# Patient Record
Sex: Female | Born: 1985 | Race: White | Hispanic: No | Marital: Single | Smoking: Current every day smoker
Health system: Southern US, Community
[De-identification: ages and names within clinical notes are randomized; demographics above are authoritative.]

## PROBLEM LIST (undated history)

## (undated) DIAGNOSIS — K219 Gastro-esophageal reflux disease without esophagitis: Secondary | ICD-10-CM

## (undated) DIAGNOSIS — J45909 Unspecified asthma, uncomplicated: Secondary | ICD-10-CM

## (undated) DIAGNOSIS — C801 Malignant (primary) neoplasm, unspecified: Secondary | ICD-10-CM

## (undated) DIAGNOSIS — E237 Disorder of pituitary gland, unspecified: Secondary | ICD-10-CM

## (undated) DIAGNOSIS — G43909 Migraine, unspecified, not intractable, without status migrainosus: Secondary | ICD-10-CM

## (undated) DIAGNOSIS — R569 Unspecified convulsions: Secondary | ICD-10-CM

## (undated) HISTORY — PX: TUBAL LIGATION: SHX77

## (undated) HISTORY — PX: TERATOMA EXCISION: SHX2491

## (undated) HISTORY — PX: MOUTH SURGERY: SHX715

## (undated) HISTORY — DX: Unspecified asthma, uncomplicated: J45.909

## (undated) HISTORY — DX: Migraine, unspecified, not intractable, without status migrainosus: G43.909

---
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
## 2006-02-15 ENCOUNTER — Emergency Department: Payer: Self-pay | Admitting: Emergency Medicine

## 2007-05-12 ENCOUNTER — Emergency Department: Payer: Self-pay | Admitting: Emergency Medicine

## 2007-05-28 ENCOUNTER — Emergency Department: Payer: Self-pay | Admitting: Emergency Medicine

## 2009-02-05 ENCOUNTER — Emergency Department: Payer: Self-pay | Admitting: Emergency Medicine

## 2009-08-03 ENCOUNTER — Emergency Department: Payer: Self-pay | Admitting: Emergency Medicine

## 2010-03-02 ENCOUNTER — Emergency Department: Payer: Self-pay | Admitting: Unknown Physician Specialty

## 2010-06-19 ENCOUNTER — Emergency Department: Payer: Self-pay | Admitting: Emergency Medicine

## 2010-10-20 ENCOUNTER — Emergency Department: Payer: Self-pay | Admitting: *Deleted

## 2011-03-11 ENCOUNTER — Emergency Department: Payer: Self-pay | Admitting: Emergency Medicine

## 2011-03-15 ENCOUNTER — Emergency Department: Payer: Self-pay | Admitting: Emergency Medicine

## 2011-08-08 ENCOUNTER — Emergency Department: Payer: Self-pay | Admitting: Emergency Medicine

## 2011-12-01 ENCOUNTER — Emergency Department: Payer: Self-pay | Admitting: Emergency Medicine

## 2012-05-19 ENCOUNTER — Emergency Department: Payer: Self-pay | Admitting: Emergency Medicine

## 2012-08-18 ENCOUNTER — Emergency Department: Payer: Self-pay | Admitting: Emergency Medicine

## 2012-09-05 ENCOUNTER — Emergency Department: Payer: Self-pay | Admitting: Unknown Physician Specialty

## 2013-01-09 ENCOUNTER — Emergency Department: Payer: Self-pay | Admitting: Emergency Medicine

## 2013-01-12 ENCOUNTER — Emergency Department: Payer: Self-pay | Admitting: Emergency Medicine

## 2013-03-18 ENCOUNTER — Emergency Department: Payer: Self-pay | Admitting: Emergency Medicine

## 2013-06-16 ENCOUNTER — Emergency Department: Payer: Self-pay | Admitting: Emergency Medicine

## 2013-06-16 LAB — RAPID INFLUENZA A&B ANTIGENS

## 2013-06-19 LAB — BETA STREP CULTURE(ARMC)

## 2014-02-08 ENCOUNTER — Emergency Department: Payer: Self-pay | Admitting: Emergency Medicine

## 2014-02-08 LAB — BASIC METABOLIC PANEL
ANION GAP: 8 (ref 7–16)
BUN: 10 mg/dL (ref 7–18)
CREATININE: 0.67 mg/dL (ref 0.60–1.30)
Calcium, Total: 8.1 mg/dL — ABNORMAL LOW (ref 8.5–10.1)
Chloride: 105 mmol/L (ref 98–107)
Co2: 25 mmol/L (ref 21–32)
GLUCOSE: 106 mg/dL — AB (ref 65–99)
Potassium: 4.5 mmol/L (ref 3.5–5.1)
SODIUM: 138 mmol/L (ref 136–145)

## 2014-02-08 LAB — CBC
HCT: 43.8 % (ref 35.0–47.0)
HGB: 14.3 g/dL (ref 12.0–16.0)
MCH: 29.4 pg (ref 26.0–34.0)
MCHC: 32.6 g/dL (ref 32.0–36.0)
MCV: 90 fL (ref 80–100)
Platelet: 316 10*3/uL (ref 150–440)
RBC: 4.85 10*6/uL (ref 3.80–5.20)
RDW: 13.5 % (ref 11.5–14.5)
WBC: 12.4 10*3/uL — AB (ref 3.6–11.0)

## 2014-02-08 LAB — TROPONIN I: Troponin-I: <0.02 ng/mL

## 2014-05-10 ENCOUNTER — Emergency Department: Payer: Self-pay | Admitting: Emergency Medicine

## 2014-05-12 ENCOUNTER — Emergency Department: Payer: Self-pay | Admitting: Emergency Medicine

## 2014-06-15 IMAGING — CR DG CHEST 2V
1 series · 2 of 2 positions shown · non-contrast
Comparison: PA and lateral chest 03/18/2013.

CLINICAL DATA: Cough, congestion and wheezing.

EXAM:
CHEST  2 VIEW

[Series 1: w chest pa · 0.14mm/px · 2 of 2 slices shown]
[im 1/2]
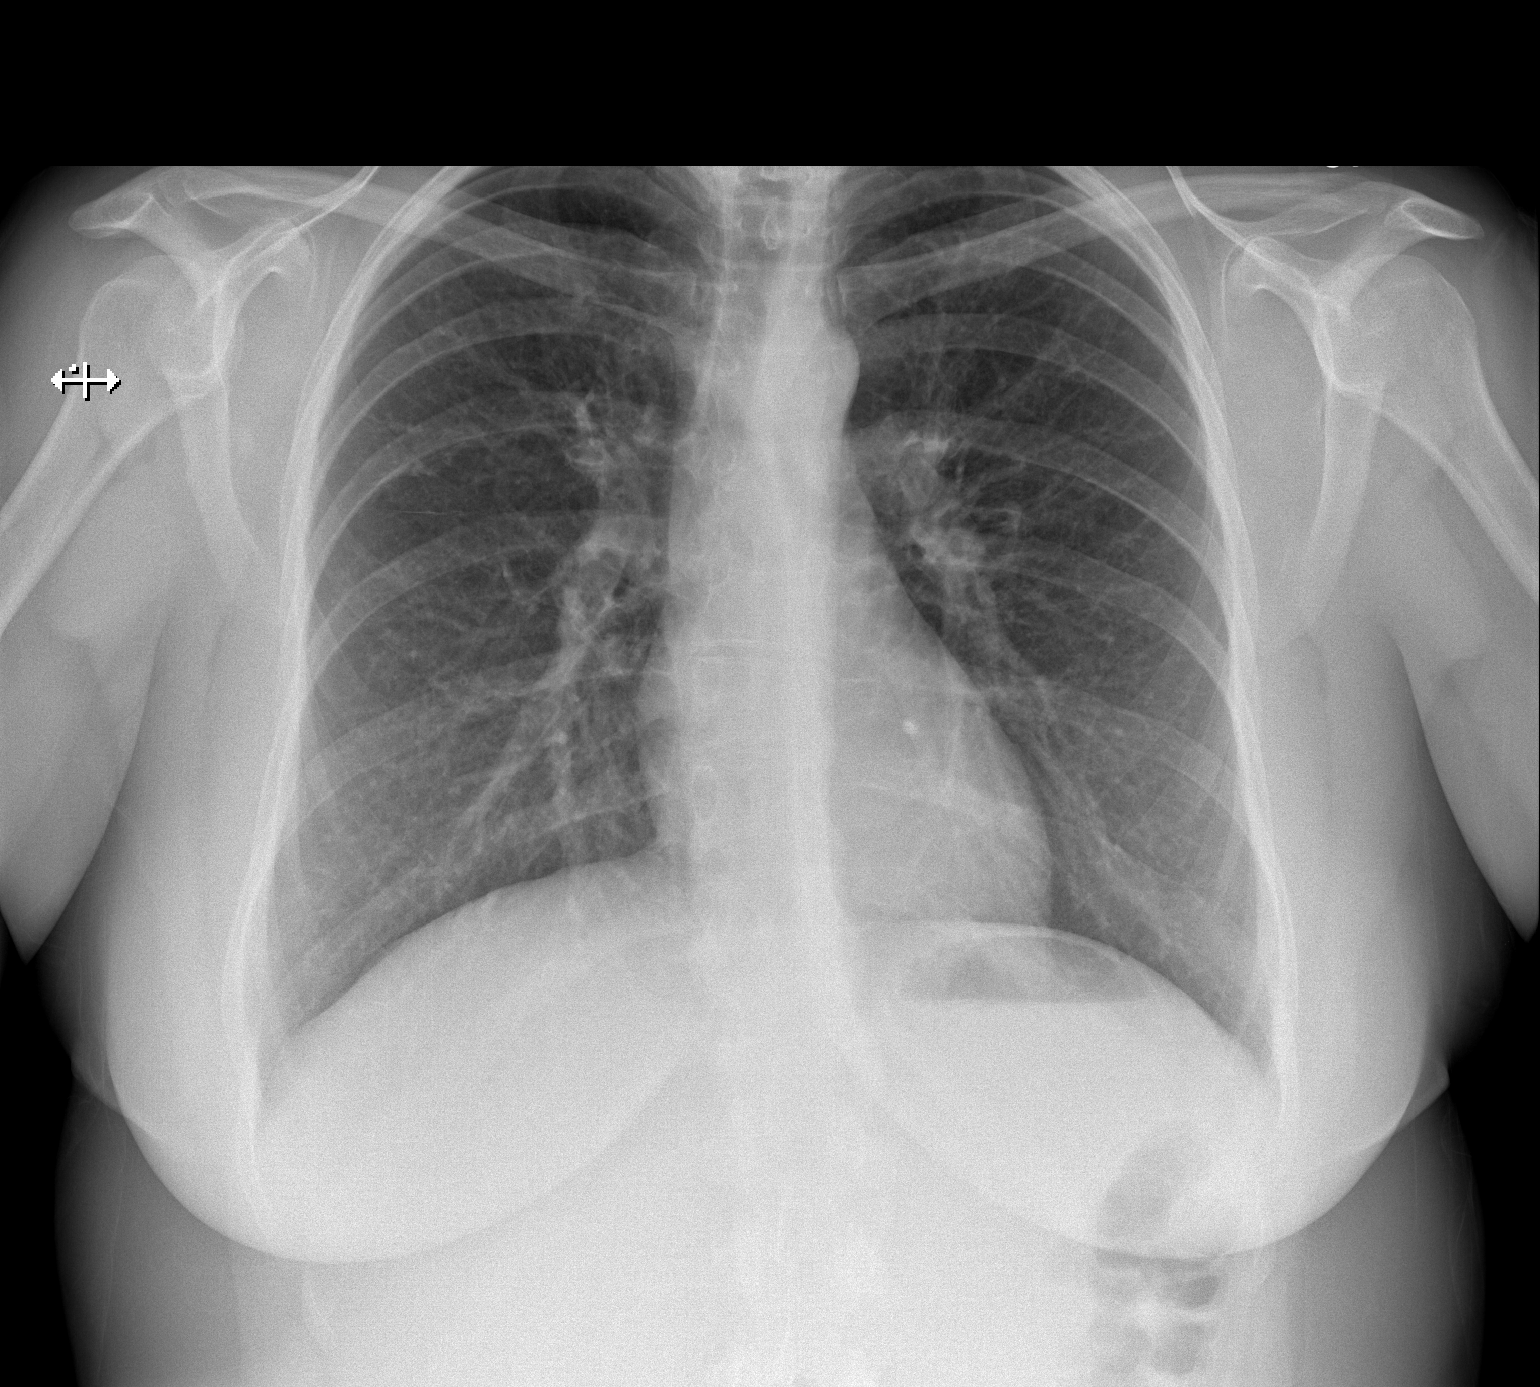
[im 2/2]
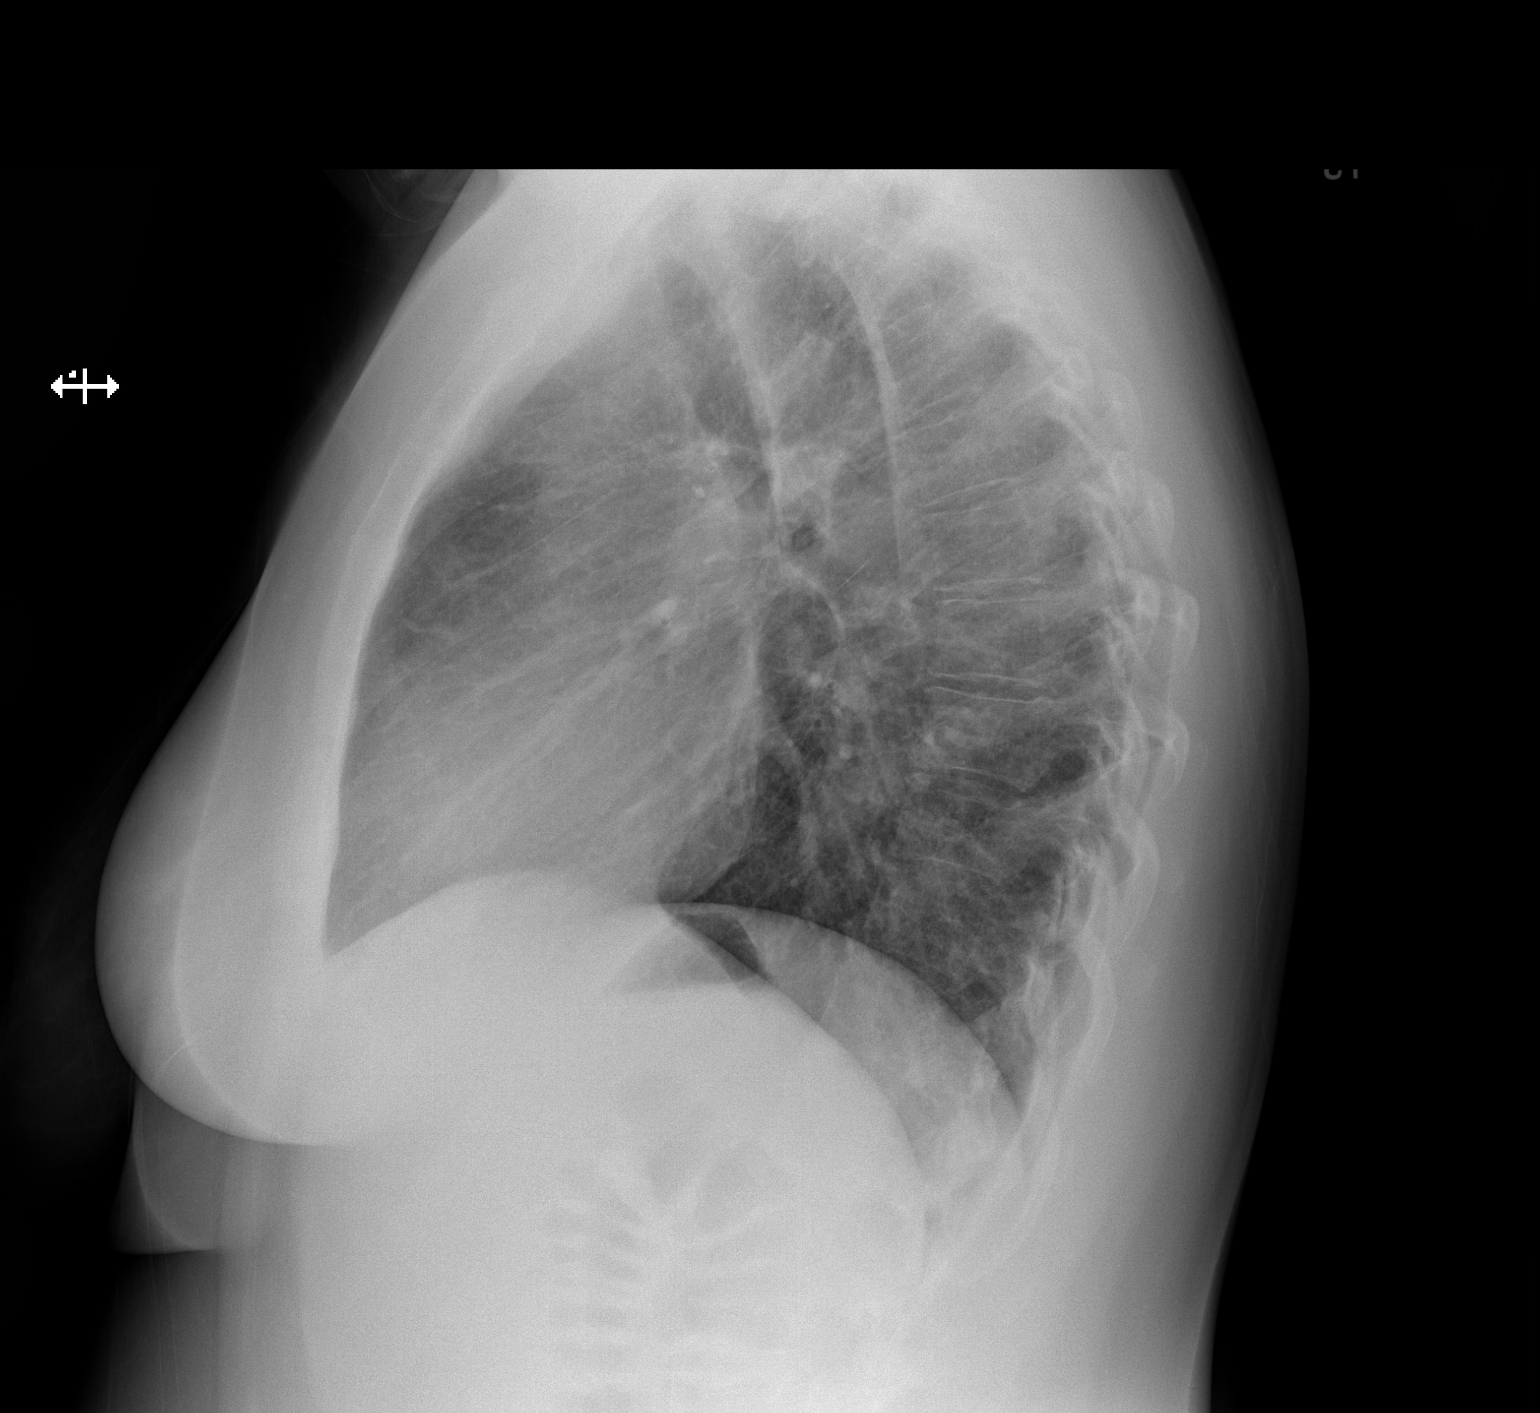

[2 of 2 positions shown; findings below may reference images not displayed]

FINDINGS: Lungs clear. Heart size normal. No pneumothorax or pleural effusion.
IMPRESSION: Negative chest.

## 2014-06-23 ENCOUNTER — Ambulatory Visit: Payer: Self-pay | Admitting: Nurse Practitioner

## 2014-06-28 LAB — CBC AND DIFFERENTIAL
HEMATOCRIT: 40 % (ref 36–46)
Hemoglobin: 13.8 g/dL (ref 12.0–16.0)
NEUTROS ABS: 7 /uL
Platelets: 342 10*3/uL (ref 150–399)
WBC: 12.2 10^3/mL

## 2014-06-28 LAB — BASIC METABOLIC PANEL
BUN: 14 mg/dL (ref 4–21)
Creatinine: 0.6 mg/dL (ref 0.5–1.1)
Glucose: 77 mg/dL
POTASSIUM: 3.8 mmol/L (ref 3.4–5.3)
Sodium: 141 mmol/L (ref 137–147)

## 2014-06-28 LAB — HEPATIC FUNCTION PANEL
ALK PHOS: 71 U/L (ref 25–125)
ALT: 19 U/L (ref 7–35)
AST: 15 U/L (ref 13–35)
BILIRUBIN DIRECT: 0.09 mg/dL (ref 0.01–0.4)
Bilirubin, Total: 0.3 mg/dL

## 2014-06-28 LAB — HEMOGLOBIN A1C: Hemoglobin A1C: 5.6

## 2014-06-28 LAB — TSH: TSH: 1.58 u[IU]/mL (ref 0.41–5.90)

## 2014-08-30 ENCOUNTER — Emergency Department
Admission: EM | Admit: 2014-08-30 | Discharge: 2014-08-30 | Disposition: A | Discharge status: Home / Self Care | Payer: Self-pay | Attending: Emergency Medicine | Admitting: Emergency Medicine

## 2014-08-30 ENCOUNTER — Emergency Department: Payer: Self-pay

## 2014-08-30 ENCOUNTER — Encounter: Payer: Self-pay | Admitting: Emergency Medicine

## 2014-08-30 DIAGNOSIS — Z91040 Latex allergy status: Secondary | ICD-10-CM | POA: Insufficient documentation

## 2014-08-30 DIAGNOSIS — Y9289 Other specified places as the place of occurrence of the external cause: Secondary | ICD-10-CM | POA: Insufficient documentation

## 2014-08-30 DIAGNOSIS — W500XXA Accidental hit or strike by another person, initial encounter: Secondary | ICD-10-CM | POA: Insufficient documentation

## 2014-08-30 DIAGNOSIS — Y998 Other external cause status: Secondary | ICD-10-CM | POA: Insufficient documentation

## 2014-08-30 DIAGNOSIS — Z720 Tobacco use: Secondary | ICD-10-CM | POA: Insufficient documentation

## 2014-08-30 DIAGNOSIS — Z880 Allergy status to penicillin: Secondary | ICD-10-CM | POA: Insufficient documentation

## 2014-08-30 DIAGNOSIS — Y9389 Activity, other specified: Secondary | ICD-10-CM | POA: Insufficient documentation

## 2014-08-30 DIAGNOSIS — S9031XA Contusion of right foot, initial encounter: Secondary | ICD-10-CM | POA: Insufficient documentation

## 2014-08-30 MED ORDER — KETOROLAC TROMETHAMINE 30 MG/ML IJ SOLN
60.0000 mg | Freq: Once | INTRAMUSCULAR | Status: AC
  Administered 2014-08-30: 60 mg via INTRAMUSCULAR

## 2014-08-30 MED ORDER — IBUPROFEN 800 MG PO TABS: 800.0000 mg | ORAL_TABLET | Freq: Three times a day (TID) | ORAL | Status: DC | PRN

## 2014-08-30 MED ORDER — KETOROLAC TROMETHAMINE 30 MG/ML IJ SOLN
INTRAMUSCULAR | Status: AC
  Administered 2014-08-30: 60 mg via INTRAMUSCULAR
  Filled 2014-08-30: qty 1

## 2014-08-30 MED ORDER — KETOROLAC TROMETHAMINE 60 MG/2ML IM SOLN
INTRAMUSCULAR | Status: AC
  Filled 2014-08-30: qty 2

## 2014-08-30 NOTE — Discharge Instructions (Signed)
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
## 2014-09-01 ENCOUNTER — Telehealth: Payer: Self-pay | Admitting: Emergency Medicine

## 2014-11-16 ENCOUNTER — Emergency Department
Admission: EM | Admit: 2014-11-16 | Discharge: 2014-11-16 | Disposition: A | Discharge status: Home / Self Care | Payer: Self-pay | Attending: Student | Admitting: Student

## 2014-11-16 ENCOUNTER — Emergency Department: Payer: Self-pay

## 2014-11-16 ENCOUNTER — Encounter: Payer: Self-pay | Admitting: Emergency Medicine

## 2014-11-16 DIAGNOSIS — Y9389 Activity, other specified: Secondary | ICD-10-CM | POA: Insufficient documentation

## 2014-11-16 DIAGNOSIS — S63501A Unspecified sprain of right wrist, initial encounter: Secondary | ICD-10-CM | POA: Insufficient documentation

## 2014-11-16 DIAGNOSIS — W231XXA Caught, crushed, jammed, or pinched between stationary objects, initial encounter: Secondary | ICD-10-CM | POA: Insufficient documentation

## 2014-11-16 DIAGNOSIS — Z91040 Latex allergy status: Secondary | ICD-10-CM | POA: Insufficient documentation

## 2014-11-16 DIAGNOSIS — Y998 Other external cause status: Secondary | ICD-10-CM | POA: Insufficient documentation

## 2014-11-16 DIAGNOSIS — Y92007 Garden or yard of unspecified non-institutional (private) residence as the place of occurrence of the external cause: Secondary | ICD-10-CM | POA: Insufficient documentation

## 2014-11-16 DIAGNOSIS — Z720 Tobacco use: Secondary | ICD-10-CM | POA: Insufficient documentation

## 2014-11-16 DIAGNOSIS — Z3202 Encounter for pregnancy test, result negative: Secondary | ICD-10-CM | POA: Insufficient documentation

## 2014-11-16 DIAGNOSIS — Z880 Allergy status to penicillin: Secondary | ICD-10-CM | POA: Insufficient documentation

## 2014-11-16 HISTORY — DX: Unspecified convulsions: R56.9

## 2014-11-16 HISTORY — DX: Gastro-esophageal reflux disease without esophagitis: K21.9

## 2014-11-16 HISTORY — DX: Disorder of pituitary gland, unspecified: E23.7

## 2014-11-16 LAB — POCT PREGNANCY, URINE: PREG TEST UR: NEGATIVE

## 2014-11-16 MED ORDER — IBUPROFEN 800 MG PO TABS
800.0000 mg | ORAL_TABLET | Freq: Once | ORAL | Status: AC
  Administered 2014-11-16: 800 mg via ORAL
  Filled 2014-11-16: qty 1

## 2014-11-16 MED ORDER — NAPROXEN 500 MG PO TABS
500.0000 mg | ORAL_TABLET | Freq: Once | ORAL | Status: DC
Start: 2014-11-16 — End: 2014-11-16
  Filled 2014-11-16: qty 1

## 2014-11-16 MED ORDER — NAPROXEN 500 MG PO TABS: 500.0000 mg | ORAL_TABLET | Freq: Two times a day (BID) | ORAL | Status: DC

## 2014-11-16 MED ORDER — TRAMADOL HCL 50 MG PO TABS: 50.0000 mg | ORAL_TABLET | Freq: Four times a day (QID) | ORAL | Status: DC | PRN

## 2014-11-16 NOTE — Discharge Instructions (Signed)
Wear splint for 2-3 days as needed. °

## 2014-11-16 NOTE — ED Notes (Addendum)
Wrist got stuck between bricks while building a fountain in her yard yesterday, +pulse
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
## 2015-08-14 ENCOUNTER — Encounter: Payer: Self-pay | Admitting: Medical Oncology

## 2015-08-14 ENCOUNTER — Emergency Department
Admission: EM | Admit: 2015-08-14 | Discharge: 2015-08-14 | Disposition: A | Discharge status: Home / Self Care | Payer: Self-pay | Attending: Emergency Medicine | Admitting: Emergency Medicine

## 2015-08-14 ENCOUNTER — Emergency Department: Payer: Self-pay

## 2015-08-14 DIAGNOSIS — F17200 Nicotine dependence, unspecified, uncomplicated: Secondary | ICD-10-CM | POA: Insufficient documentation

## 2015-08-14 DIAGNOSIS — J4521 Mild intermittent asthma with (acute) exacerbation: Secondary | ICD-10-CM | POA: Insufficient documentation

## 2015-08-14 DIAGNOSIS — F17210 Nicotine dependence, cigarettes, uncomplicated: Secondary | ICD-10-CM

## 2015-08-14 DIAGNOSIS — Z8669 Personal history of other diseases of the nervous system and sense organs: Secondary | ICD-10-CM | POA: Insufficient documentation

## 2015-08-14 MED ORDER — ALBUTEROL SULFATE HFA 108 (90 BASE) MCG/ACT IN AERS: 2.0000 | INHALATION_SPRAY | Freq: Four times a day (QID) | RESPIRATORY_TRACT | Status: AC | PRN

## 2015-08-14 MED ORDER — PREDNISONE 10 MG PO TABS: ORAL_TABLET | ORAL | Status: AC

## 2015-08-14 MED ORDER — BENZONATATE 100 MG PO CAPS: 100.0000 mg | ORAL_CAPSULE | Freq: Three times a day (TID) | ORAL | Status: AC | PRN

## 2015-08-14 MED ORDER — IPRATROPIUM-ALBUTEROL 0.5-2.5 (3) MG/3ML IN SOLN
3.0000 mL | Freq: Once | RESPIRATORY_TRACT | Status: AC
  Administered 2015-08-14: 3 mL via RESPIRATORY_TRACT
  Filled 2015-08-14: qty 3

## 2015-08-14 NOTE — ED Notes (Signed)
Pt reports body aches, chills, fever that began Saturday.

## 2015-08-14 NOTE — ED Provider Notes (Signed)
[facility] Emergency Department Provider Note   ____________________________________________  Time seen: Approximately 12:35 PM  I have reviewed the triage vital signs and the nursing notes.   HISTORY  Chief Complaint Fever; Generalized Body Aches; and Chills   HPI Cassandra Caldwell is a 30 y.o. female is here with complaint of body aches, runny nose, fever and chills for about 2-3 days. Patient also has a slightly productive cough and ear pain. She is unable to say what her temperature was at home as her thermometer is broken. She has taken some over-the-counter medications without any relief. She states she was diagnosed at one time with "asthmatic bronchitis". Patient continues to smoke one half pack of cigarettes per day. She denies any nausea, vomiting or diarrhea. She denies any urinary symptoms.   Past Medical History  Diagnosis Date  . Seizures (HCC)   . GERD (gastroesophageal reflux disease)   . Pituitary abnormality (HCC)     There are no active problems to display for this patient.   History reviewed. No pertinent past surgical history.  Current Outpatient Rx  Name  Route  Sig  Dispense  Refill  . albuterol (PROVENTIL HFA;VENTOLIN HFA) 108 (90 Base) MCG/ACT inhaler   Inhalation   Inhale 2 puffs into the lungs every 6 (six) hours as needed for wheezing or shortness of breath.   1 Inhaler   2   . benzonatate (TESSALON PERLES) 100 MG capsule   Oral   Take 1 capsule (100 mg total) by mouth 3 (three) times daily as needed for cough.   30 capsule   0   . predniSONE (DELTASONE) 10 MG tablet      Take 6 tablets  today, on day 2 take 5 tablets, day 3 take 4 tablets, day 4 take 3 tablets, day 5 take  2 tablets and 1 tablet the last day   21 tablet   0     Allergies Latex; Magnesium-containing compounds; Phenergan; Sulfa antibiotics; and Penicillins  No family history on file.  Social History Social History  Substance Use  Topics  . Smoking status: Current Every Day Smoker  . Smokeless tobacco: None  . Alcohol Use: Yes    Review of Systems Constitutional: Positive fever/chills Eyes: No visual changes. ENT: No sore throat. Positive ear pain. Cardiovascular: Denies chest pain. Respiratory: Denies shortness of breath. Positive productive cough. Gastrointestinal: No abdominal pain.  No nausea, no vomiting.  No diarrhea.  No constipation. Genitourinary: Negative for dysuria. Musculoskeletal: Negative for back pain. Positive generalized body aches. Skin: Negative for rash. Neurological: Negative for headaches, focal weakness or numbness.  10-point ROS otherwise negative.  ____________________________________________   PHYSICAL EXAM:  VITAL SIGNS: ED Triage Vitals  Enc Vitals Group     BP 08/14/15 1146 151/87 mmHg     Pulse Rate 08/14/15 1145 101     Resp 08/14/15 1145 18     Temp 08/14/15 1145 98.7 F (37.1 C)     Temp Source 08/14/15 1145 Oral     SpO2 08/14/15 1145 98 %     Weight 08/14/15 1145 200 lb (90.719 kg)     Height 08/14/15 1145 5\' 7"  (1.702 m)     Head Cir --      Peak Flow --      Pain Score 08/14/15 1146 10     Pain Loc --      Pain Edu? --      Excl. in GC? --  Constitutional: Alert and oriented. Well appearing and in no acute distress. Eyes: Conjunctivae are normal. PERRL. EOMI. Head: Atraumatic. Nose: Moderate congestion/rhinnorhea. EACs are clear. TMs are dull bilaterally. Mouth/Throat: Mucous membranes are moist.  Oropharynx non-erythematous. Posterior pharynx with moderate in injection and drainage. Neck: No stridor.   Hematological/Lymphatic/Immunilogical: No cervical lymphadenopathy. Cardiovascular: Normal rate, regular rhythm. Grossly normal heart sounds.  Good peripheral circulation. Respiratory: Normal respiratory effort.  No retractions. Lungs patient with bilateral expiratory wheeze heard throughout. Gastrointestinal: Soft and nontender. No distention. No  abdominal bruits. No CVA tenderness. Musculoskeletal: Moves upper and lower extremities without any difficulty. Normal gait was noted. Neurologic:  Normal speech and language. No gross focal neurologic deficits are appreciated. No gait instability. Skin:  Skin is warm, dry and intact. No rash noted. Psychiatric: Mood and affect are normal. Speech and behavior are normal.  ____________________________________________   LABS (all labs ordered are listed, but only abnormal results are displayed)  Labs Reviewed - No data to display ____________________________________________  EKG  Deferred ____________________________________________  RADIOLOGY  Chest x-ray per radiologist shows no consolidation. I, Tommi Rumps, personally viewed and evaluated these images (plain radiographs) as part of my medical decision making, as well as reviewing the written report by the radiologist. ____________________________________________   PROCEDURES  Procedure(s) performed: None  Critical Care performed: No  ____________________________________________   INITIAL IMPRESSION / ASSESSMENT AND PLAN / ED COURSE  Pertinent labs & imaging results that were available during my care of the patient were reviewed by me and considered in my medical decision making (see chart for details).  Patient was encouraged to discontinue smoking. Prescription for [facility] as a for coughing, prednisone 6 day taper 60 mg and albuterol inhaler. Patient is follow-up with [facility] clinic if any continued problems. ____________________________________________   FINAL CLINICAL IMPRESSION(S) / ED DIAGNOSES  Final diagnoses:  Asthmatic bronchitis, mild intermittent, with acute exacerbation  Cigarette smoker      NEW MEDICATIONS STARTED DURING THIS VISIT:  New Prescriptions   ALBUTEROL (PROVENTIL HFA;VENTOLIN HFA) 108 (90 BASE) MCG/ACT INHALER    Inhale 2 puffs into the lungs every 6 (six) hours as  needed for wheezing or shortness of breath.   BENZONATATE (TESSALON PERLES) 100 MG CAPSULE    Take 1 capsule (100 mg total) by mouth 3 (three) times daily as needed for cough.   PREDNISONE (DELTASONE) 10 MG TABLET    Take 6 tablets  today, on day 2 take 5 tablets, day 3 take 4 tablets, day 4 take 3 tablets, day 5 take  2 tablets and 1 tablet the last day     Note:  This document was prepared using Dragon voice recognition software and may include unintentional dictation errors.    Tommi Rumps, PA-C 08/14/15 1441  Governor Rooks, MD 08/15/15 [phone]

## 2015-08-14 NOTE — ED Notes (Signed)
States she developed body aches runny nose  Fever/chills about 2 days ago

## 2015-08-14 NOTE — Discharge Instructions (Signed)
Begin prednisone today as directed. Tessalon Perles as needed for cough. Proventil inhaler as needed for wheezing. Increase fluids. Tylenol or ibuprofen if needed for fever. Discontinue smoking. [facility] if any continued problems.
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
## 2016-01-15 ENCOUNTER — Encounter: Payer: Self-pay | Admitting: Emergency Medicine

## 2016-01-15 ENCOUNTER — Emergency Department
Admission: EM | Admit: 2016-01-15 | Discharge: 2016-01-15 | Disposition: A | Discharge status: Home / Self Care | Payer: Self-pay | Attending: Emergency Medicine | Admitting: Emergency Medicine

## 2016-01-15 DIAGNOSIS — Z79899 Other long term (current) drug therapy: Secondary | ICD-10-CM | POA: Insufficient documentation

## 2016-01-15 DIAGNOSIS — F17210 Nicotine dependence, cigarettes, uncomplicated: Secondary | ICD-10-CM | POA: Insufficient documentation

## 2016-01-15 DIAGNOSIS — Z91040 Latex allergy status: Secondary | ICD-10-CM | POA: Insufficient documentation

## 2016-01-15 DIAGNOSIS — J45909 Unspecified asthma, uncomplicated: Secondary | ICD-10-CM | POA: Insufficient documentation

## 2016-01-15 DIAGNOSIS — G43001 Migraine without aura, not intractable, with status migrainosus: Secondary | ICD-10-CM | POA: Insufficient documentation

## 2016-01-15 DIAGNOSIS — G43901 Migraine, unspecified, not intractable, with status migrainosus: Secondary | ICD-10-CM

## 2016-01-15 LAB — URINALYSIS COMPLETE WITH MICROSCOPIC (ARMC ONLY)
BACTERIA UA: NONE SEEN
Bilirubin Urine: NEGATIVE
GLUCOSE, UA: NEGATIVE mg/dL
Hgb urine dipstick: NEGATIVE
Ketones, ur: NEGATIVE mg/dL
NITRITE: NEGATIVE
Protein, ur: NEGATIVE mg/dL
SPECIFIC GRAVITY, URINE: 1.019 (ref 1.005–1.030)
pH: 6 (ref 5.0–8.0)

## 2016-01-15 LAB — POC URINE PREG, ED: PREG TEST UR: NEGATIVE

## 2016-01-15 MED ORDER — DIPHENHYDRAMINE HCL 25 MG PO CAPS
25.0000 mg | ORAL_CAPSULE | Freq: Once | ORAL | Status: AC
  Administered 2016-01-15: 25 mg via ORAL
  Filled 2016-01-15: qty 1

## 2016-01-15 MED ORDER — METOCLOPRAMIDE HCL 10 MG PO TABS
10.0000 mg | ORAL_TABLET | Freq: Four times a day (QID) | ORAL | 0 refills | Status: AC | PRN
Start: 2016-01-15 — End: ?

## 2016-01-15 MED ORDER — DIPHENHYDRAMINE HCL 25 MG PO CAPS: 50.0000 mg | ORAL_CAPSULE | Freq: Four times a day (QID) | ORAL | 0 refills | Status: AC | PRN

## 2016-01-15 MED ORDER — METOCLOPRAMIDE HCL 10 MG PO TABS
10.0000 mg | ORAL_TABLET | Freq: Once | ORAL | Status: AC
  Administered 2016-01-15: 10 mg via ORAL
  Filled 2016-01-15: qty 1

## 2016-01-15 MED ORDER — PREDNISONE 20 MG PO TABS
60.0000 mg | ORAL_TABLET | Freq: Once | ORAL | Status: AC
  Administered 2016-01-15: 60 mg via ORAL
  Filled 2016-01-15: qty 3

## 2016-01-15 MED ORDER — KETOROLAC TROMETHAMINE 60 MG/2ML IM SOLN
15.0000 mg | Freq: Once | INTRAMUSCULAR | Status: AC
  Administered 2016-01-15: 15 mg via INTRAMUSCULAR
  Filled 2016-01-15: qty 2

## 2016-01-15 NOTE — ED Provider Notes (Signed)
[facility] Emergency Department Provider Note  ____________________________________________  Time seen: Approximately 10:45 AM  I have reviewed the triage vital signs and the nursing notes.   HISTORY  Chief Complaint Migraine    HPI Cassandra Caldwell is a 30 y.o. female who complains of headache for the past 5 days. Feels entirely consistent with previously established diagnosed migraine pattern. She tried taking NSAIDs and Excedrin without relief. She has photophobia and phonophobia. Nausea but no vomiting. No paresthesias or focal weakness. No significant vision changes. No head trauma or recent illness. No neck stiffness fevers or chills.  Pain is bilateral frontal. Nonradiating. Throbbing and aching. Constant   Past Medical History:  Diagnosis Date  . Asthma   . GERD (gastroesophageal reflux disease)   . Migraines   . Pituitary abnormality (HCC)   . Seizures (HCC)      There are no active problems to display for this patient.    Past Surgical History:  Procedure Laterality Date  . MOUTH SURGERY       Prior to Admission medications   Medication Sig Start Date End Date Taking? Authorizing Provider  albuterol (PROVENTIL HFA;VENTOLIN HFA) 108 (90 Base) MCG/ACT inhaler Inhale 2 puffs into the lungs every 6 (six) hours as needed for wheezing or shortness of breath. 08/14/15   Tommi Rumps, PA-C  benzonatate (TESSALON PERLES) 100 MG capsule Take 1 capsule (100 mg total) by mouth 3 (three) times daily as needed for cough. 08/14/15 08/13/16  Tommi Rumps, PA-C  diphenhydrAMINE (BENADRYL) 25 mg capsule Take 2 capsules (50 mg total) by mouth every 6 (six) hours as needed. 01/15/16   Sharman Cheek, MD  metoCLOPramide (REGLAN) 10 MG tablet Take 1 tablet (10 mg total) by mouth every 6 (six) hours as needed. 01/15/16   Sharman Cheek, MD  predniSONE (DELTASONE) 10 MG tablet Take 6 tablets  today, on day 2 take 5 tablets, day 3 take 4 tablets,  day 4 take 3 tablets, day 5 take  2 tablets and 1 tablet the last day 08/14/15   Tommi Rumps, PA-C     Allergies Latex; Magnesium-containing compounds; Phenergan [promethazine hcl]; Sulfa antibiotics; and Penicillins   Family History  Problem Relation Age of Onset  . Cancer Mother   . Diabetes Mother   . COPD Mother   . Congestive Heart Failure Mother   . Cancer Father   . Seizures Father     Social History Social History  Substance Use Topics  . Smoking status: Current Every Day Smoker    Packs/day: 0.50    Types: Cigarettes  . Smokeless tobacco: Never Used  . Alcohol use No     Comment: occasionally    Review of Systems  Constitutional:   No fever or chills.  ENT:   No sore throat. No rhinorrhea. Cardiovascular:   No chest pain. Respiratory:   No dyspnea or cough. Gastrointestinal:   Negative for abdominal pain, vomiting and diarrhea.  Neurological:   Positive as above for headache  10-point ROS otherwise negative.  ____________________________________________   PHYSICAL EXAM:  VITAL SIGNS: ED Triage Vitals  Enc Vitals Group     BP 01/15/16 0835 131/88     Pulse Rate 01/15/16 0835 (!) 105     Resp 01/15/16 0835 18     Temp 01/15/16 0835 98.5 F (36.9 C)     Temp Source 01/15/16 0835 Oral     SpO2 01/15/16 0835 100 %     Weight 01/15/16  0835 210 lb (95.3 kg)     Height 01/15/16 0835 5\' 7"  (1.702 m)     Head Circumference --      Peak Flow --      Pain Score 01/15/16 1001 8     Pain Loc --      Pain Edu? --      Excl. in GC? --     Vital signs reviewed, nursing assessments reviewed.   Constitutional:   Alert and oriented. Well appearing and in no distress. Eyes:   No scleral icterus. No conjunctival pallor. PERRL. EOMI.  No nystagmus. ENT   Head:   Normocephalic and atraumatic.   Nose:   No congestion/rhinnorhea. No septal hematoma   Mouth/Throat:   MMM, no pharyngeal erythema. No peritonsillar mass.    Neck:   No stridor. No  SubQ emphysema. No meningismus. Hematological/Lymphatic/Immunilogical:   No cervical lymphadenopathy. Cardiovascular:   RRR. Symmetric bilateral radial and DP pulses.  No murmurs.  Respiratory:   Normal respiratory effort without tachypnea nor retractions. Breath sounds are clear and equal bilaterally. No wheezes/rales/rhonchi.  Musculoskeletal:   Nontender with normal range of motion in all extremities. No joint effusions.  No lower extremity tenderness.  No edema. Neurologic:   Normal speech and language.  CN 2-10 normal. Motor grossly intact. No gross focal neurologic deficits are appreciated.  Skin:    Skin is warm, dry and intact. No rash noted.  No petechiae, purpura, or bullae.  ____________________________________________    LABS (pertinent positives/negatives) (all labs ordered are listed, but only abnormal results are displayed) Labs Reviewed  URINALYSIS COMPLETEWITH MICROSCOPIC ([facility] ONLY) - Abnormal; Notable for the following:       Result Value   Color, Urine YELLOW (*)    APPearance CLEAR (*)    Leukocytes, UA TRACE (*)    Squamous Epithelial / LPF 6-30 (*)    All other components within normal limits  POC URINE PREG, ED   ____________________________________________   EKG    ____________________________________________    RADIOLOGY    ____________________________________________   PROCEDURES Procedures  ____________________________________________   INITIAL IMPRESSION / ASSESSMENT AND PLAN / ED COURSE  Pertinent labs & imaging results that were available during my care of the patient were reviewed by me and considered in my medical decision making (see chart for details).  Patient is well-appearing no acute distress. Pregnancy negative. Recurrent migraine, no red flag features. No meningismus. Considering the patient's symptoms, medical history, and physical examination today, I have low suspicion for ischemic stroke, intracranial hemorrhage,  meningitis, encephalitis, carotid or vertebral dissection, venous sinus thrombosis, MS, intracranial hypertension, glaucoma, CRAO, CRVO, or temporal arteritis.   Patient given migraine cocktail, feeling better, we'll discharge home.     Clinical Course   ____________________________________________   FINAL CLINICAL IMPRESSION(S) / ED DIAGNOSES  Final diagnoses:  Migraine with status migrainosus, not intractable, unspecified migraine type       Portions of this note were generated with dragon dictation software. Dictation errors may occur despite best attempts at proofreading.    Sharman CheekPhillip Miryam Mcelhinney, MD 01/15/16 1048

## 2016-01-15 NOTE — ED Triage Notes (Signed)
Pt from home with migraine. States she used to have them often, but has not had one in 8 years. States it started Wednesday, and she now has nausea and extreme photosensitivity. Pt alert & oriented, shielding her eyes during triage.

## 2016-08-12 IMAGING — CR DG CHEST 2V
1 series · 2 of 2 positions shown · non-contrast
Comparison: May 12, 2014

CLINICAL DATA: Fever and chills for 2 days

EXAM:
CHEST  2 VIEW

[Series 1: dg chest 2 view · 0.14mm/px · 2 of 2 slices shown]
[im 1/2]
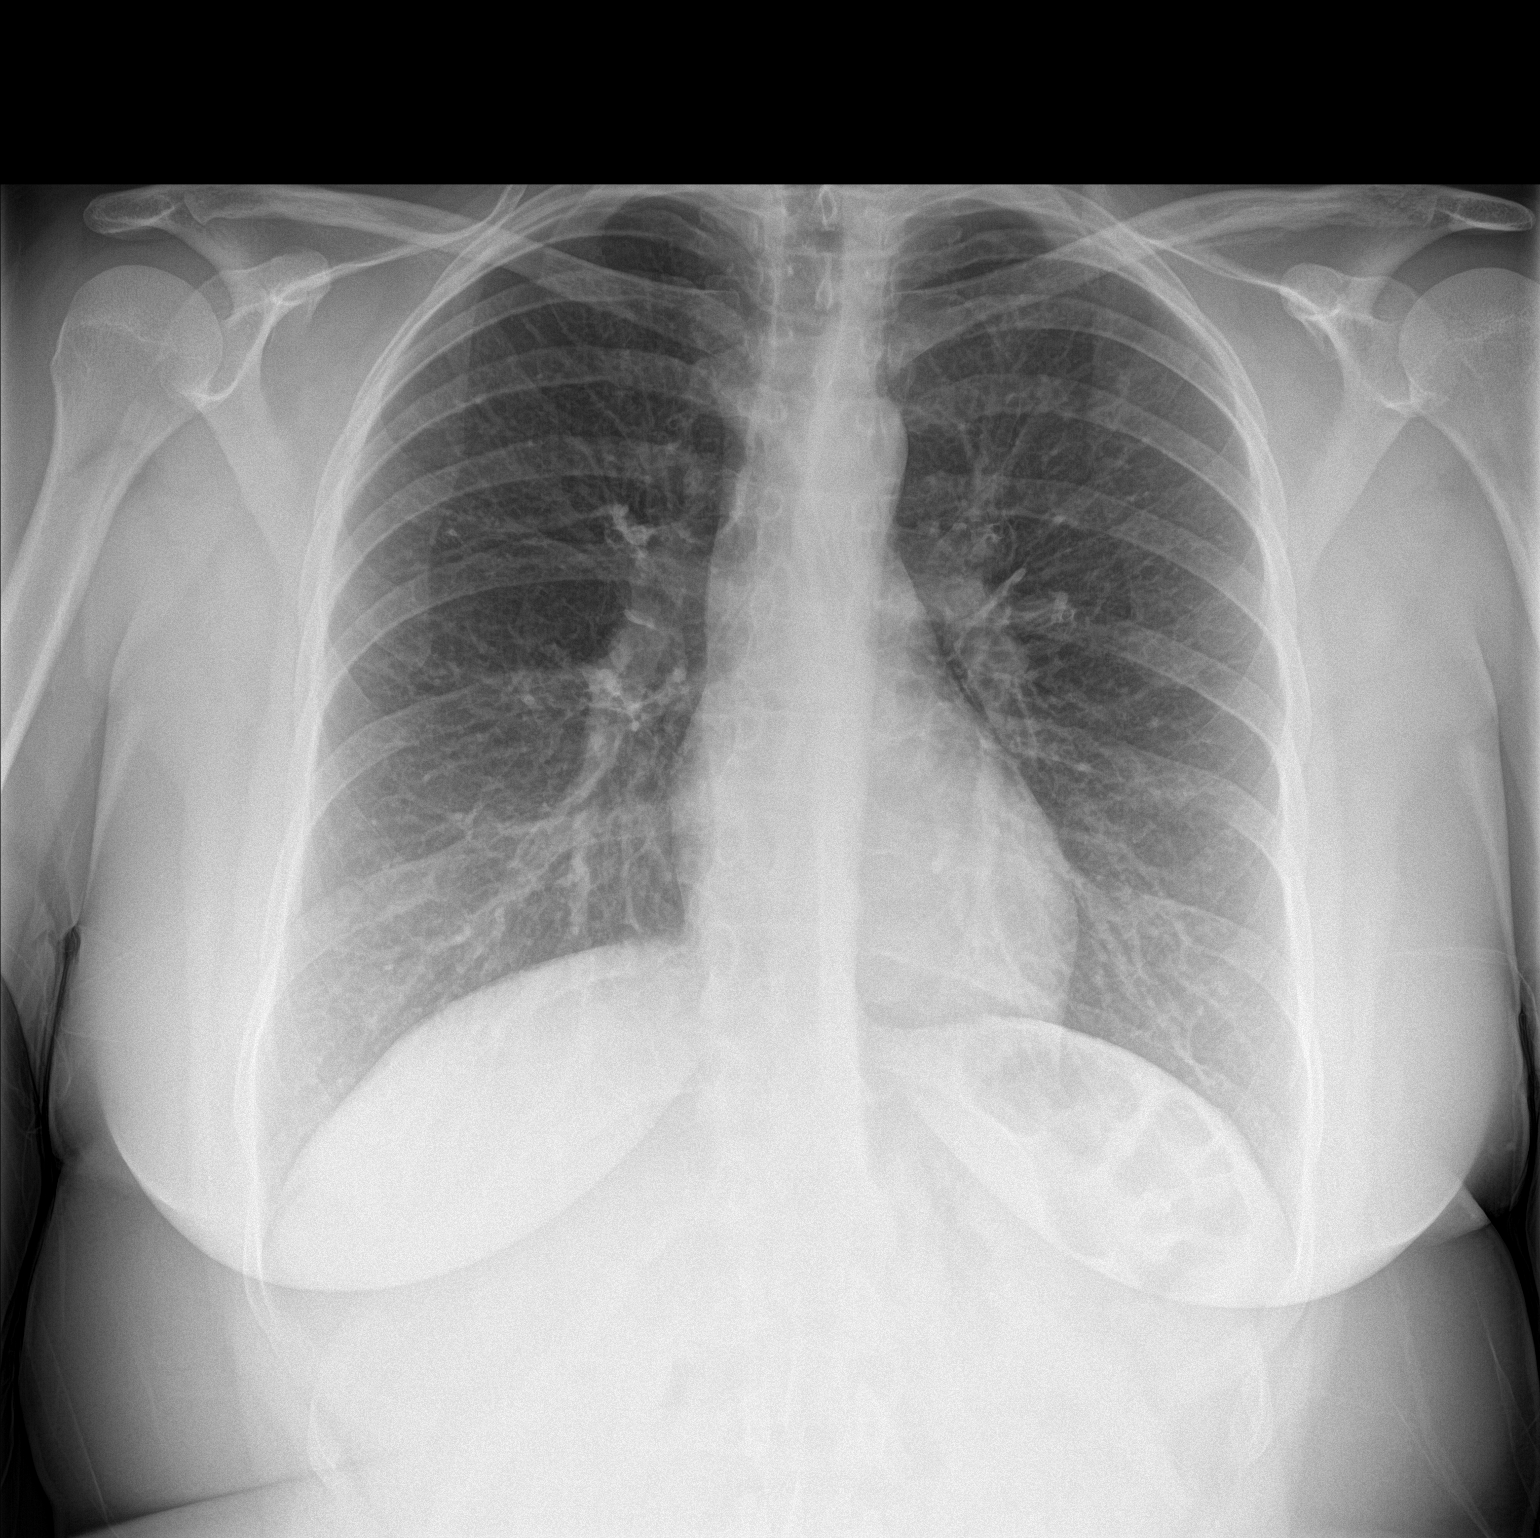
[im 2/2]
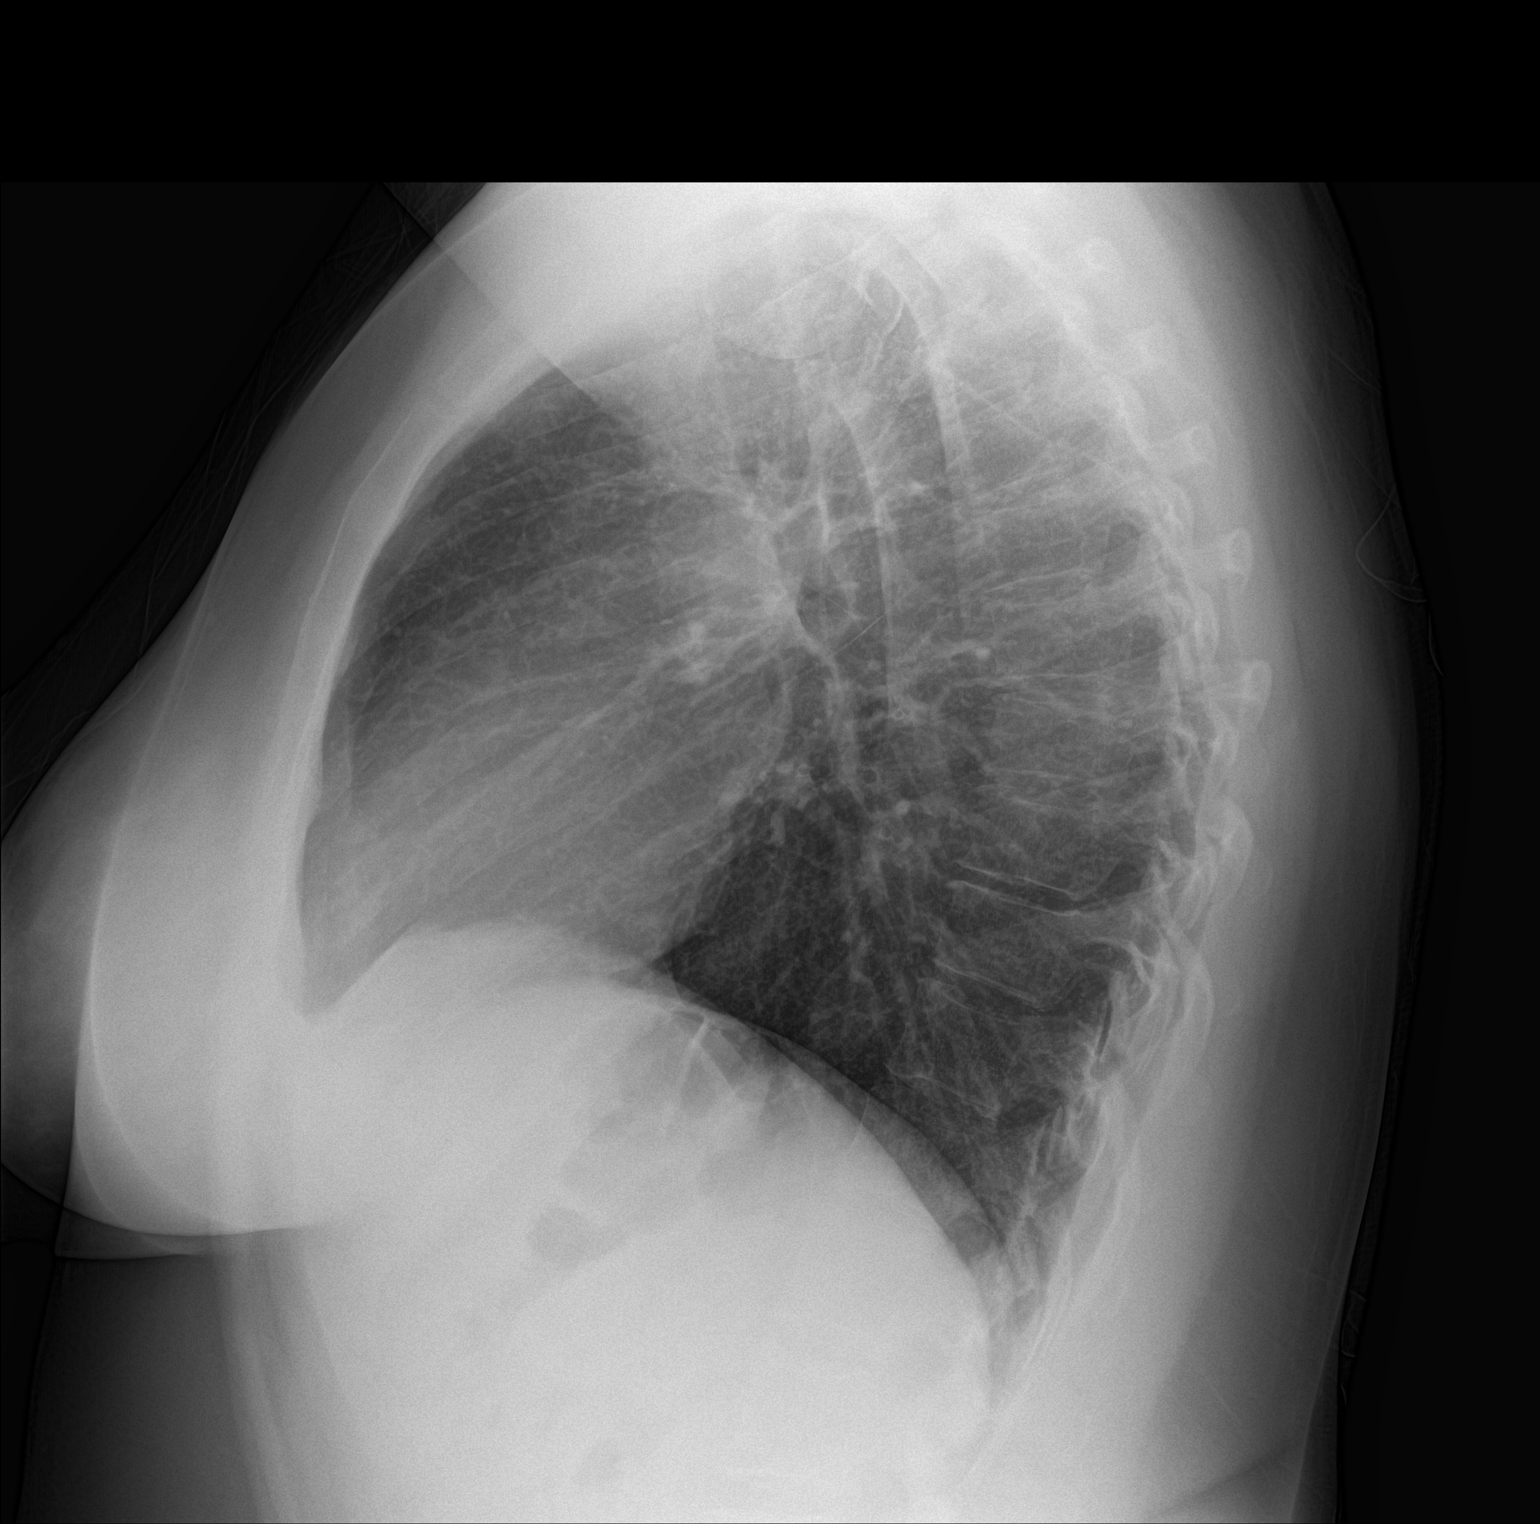

[2 of 2 positions shown; findings below may reference images not displayed]

FINDINGS: Lungs are clear. Heart size and pulmonary vascularity are normal. No
adenopathy. No bone lesions.
IMPRESSION: No edema or consolidation.

## 2018-10-21 ENCOUNTER — Other Ambulatory Visit (HOSPITAL_COMMUNITY): Payer: Self-pay | Admitting: Gastroenterology

## 2018-10-21 ENCOUNTER — Other Ambulatory Visit: Payer: Self-pay | Admitting: Gastroenterology

## 2018-10-21 DIAGNOSIS — R1013 Epigastric pain: Secondary | ICD-10-CM

## 2018-10-23 ENCOUNTER — Ambulatory Visit
Admission: RE | Admit: 2018-10-23 | Discharge: 2018-10-23 | Disposition: A | Discharge status: Home / Self Care | Payer: Self-pay | Source: Ambulatory Visit | Attending: Gastroenterology | Admitting: Gastroenterology

## 2018-10-23 ENCOUNTER — Other Ambulatory Visit: Payer: Self-pay

## 2018-10-23 DIAGNOSIS — R1013 Epigastric pain: Secondary | ICD-10-CM | POA: Insufficient documentation

## 2022-01-10 ENCOUNTER — Emergency Department
Admission: EM | Admit: 2022-01-10 | Discharge: 2022-01-10 | Disposition: A | Discharge status: Home / Self Care | Payer: Self-pay | Attending: Emergency Medicine | Admitting: Emergency Medicine

## 2022-01-10 ENCOUNTER — Other Ambulatory Visit: Payer: Self-pay

## 2022-01-10 DIAGNOSIS — Z20822 Contact with and (suspected) exposure to covid-19: Secondary | ICD-10-CM | POA: Insufficient documentation

## 2022-01-10 DIAGNOSIS — J0100 Acute maxillary sinusitis, unspecified: Secondary | ICD-10-CM | POA: Insufficient documentation

## 2022-01-10 LAB — RESP PANEL BY RT-PCR (FLU A&B, COVID) ARPGX2
Influenza A by PCR: NEGATIVE
Influenza B by PCR: NEGATIVE
SARS Coronavirus 2 by RT PCR: NEGATIVE

## 2022-01-10 MED ORDER — DOXYCYCLINE MONOHYDRATE 100 MG PO TABS: 100.0000 mg | ORAL_TABLET | Freq: Two times a day (BID) | ORAL | 0 refills | Status: AC

## 2022-01-10 NOTE — ED Provider Notes (Signed)
[facility] Provider Note  Patient Contact: 4:50 PM (approximate)   History   Weakness   HPI  Cassandra Caldwell is a 36 y.o. female presents to the [facility] department with maxillary sinus tenderness, and generalized malaise and body aches for the past 3 to 4 days.  Patient reports that she is prone to sinusitis and has not been able to work due to her symptoms.  No chest pain, chest tightness or abdominal pain.      Physical Exam   Triage Vital Signs: ED Triage Vitals  Enc Vitals Group     BP 01/10/22 1513 (!) 133/111     Pulse Rate 01/10/22 1513 (!) 114     Resp 01/10/22 1513 18     Temp 01/10/22 1513 98.3 F (36.8 C)     Temp src --      SpO2 01/10/22 1513 96 %     Weight 01/10/22 1515 228 lb (103.4 kg)     Height 01/10/22 1515 5\' 7"  (1.702 m)     Head Circumference --      Peak Flow --      Pain Score 01/10/22 1514 7     Pain Loc --      Pain Edu? --      Excl. in [place]? --     Most recent vital signs: Vitals:   01/10/22 1513  BP: (!) 133/111  Pulse: (!) 114  Resp: 18  Temp: 98.3 F (36.8 C)  SpO2: 96%     General: Alert and in no acute distress. Eyes:  PERRL. EOMI. Head: No acute traumatic findings ENT:      Nose: No congestion/rhinnorhea.      Mouth/Throat: Mucous membranes are moist.  Patient has maxillary sinus tenderness to palpation. Neck: No stridor. No cervical spine tenderness to palpation. Cardiovascular:  Good peripheral perfusion Respiratory: Normal respiratory effort without tachypnea or retractions. Lungs CTAB. Good air entry to the bases with no decreased or absent breath sounds. Gastrointestinal: Bowel sounds 4 quadrants. Soft and nontender to palpation. No guarding or rigidity. No palpable masses. No distention. No CVA tenderness. Musculoskeletal: Full range of motion to all extremities.  Neurologic:  No gross focal neurologic deficits are appreciated.  Skin:   No rash noted    ED Results / Procedures /  Treatments   Labs (all labs ordered are listed, but only abnormal results are displayed) Labs Reviewed  RESP PANEL BY RT-PCR (FLU A&B, COVID) [id number]       PROCEDURES:  Critical Care performed: No  Procedures   MEDICATIONS ORDERED IN ED: Medications - No data to display   IMPRESSION / MDM / [place] / ED COURSE  I reviewed the triage vital signs and the nursing notes.                              Assessment and plan Sinusitis 36 year old female presents to the emergency department with maxillary sinus tenderness.  Patient was mildly tachycardic at triage but vital signs otherwise reassuring.  Patient was alert and nontoxic-appearing.  Patient was prescribed doxycycline twice daily for the next 7 days after she denied possibility of pregnancy.  Also recommended Flonase over-the-counter.  Return precautions were given to return with new or worsening symptoms.  All patient questions were answered.      FINAL CLINICAL IMPRESSION(S) / ED DIAGNOSES   Final diagnoses:  Acute non-recurrent maxillary sinusitis  Rx / DC Orders   ED Discharge Orders          Ordered    doxycycline (ADOXA) 100 MG tablet  2 times daily        01/10/22 1648             Note:  This document was prepared using Dragon voice recognition software and may include unintentional dictation errors.   Vallarie Mare Sigel, PA-C 01/10/22 1651    Lucillie Garfinkel, MD 01/10/22 1958

## 2022-01-10 NOTE — ED Notes (Signed)
PT refusing EKG stating "my heartrate is usually 128-130"

## 2022-01-10 NOTE — ED Triage Notes (Signed)
Pt coming from home with sinus drainage, ear pain and weakness, started Tuesday. Pt has been tested for covid x3 with negative results. Pt was unable to be seen at urgent care.

## 2022-01-10 NOTE — Discharge Instructions (Signed)
Take Doxycyline twice daily for seven days.

## 2022-03-28 ENCOUNTER — Other Ambulatory Visit: Payer: Self-pay

## 2022-03-28 ENCOUNTER — Emergency Department
Admission: EM | Admit: 2022-03-28 | Discharge: 2022-03-28 | Disposition: A | Discharge status: Home / Self Care | Payer: Self-pay | Attending: Emergency Medicine | Admitting: Emergency Medicine

## 2022-03-28 DIAGNOSIS — B349 Viral infection, unspecified: Secondary | ICD-10-CM | POA: Insufficient documentation

## 2022-03-28 DIAGNOSIS — J45909 Unspecified asthma, uncomplicated: Secondary | ICD-10-CM | POA: Insufficient documentation

## 2022-03-28 NOTE — ED Triage Notes (Signed)
Vomiting and diarrhea since Monday.  Running fevers, since yesterday.

## 2022-03-28 NOTE — ED Provider Notes (Signed)
[facility] Provider Note    Event Date/Time   First MD Initiated Contact with Patient 03/28/22 1004     (approximate)   History   Vomiting   HPI  Cassandra Caldwell is a 36 y.o. female   with a history of pituitary adenoma migraines and seizures as well as asthma  Patient reports that she has been sick for several days now with bodyaches occasional nausea and had a cough but most her symptoms are quite bit better.  She went to work today though and was told that she could not return without a doctor's note, and she reports she came to the ER because she needs a doctor's note in order to return to work.  She thinks she may have had the flu or similar symptoms but overall she is feeling a lot better.  Presently she is not interested in getting tested for the flu or other viral illnesses, she reports she just really feels like she needed to get checked over and make certain that she could return to work soon  She did have some loose stools clear and a little bit of vomiting or dry heaving Monday.  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  May have had a little bit of fever yesterday but seems to be improving      Physical Exam   Triage Vital Signs: ED Triage Vitals  Enc Vitals Group     BP 03/28/22 0921 (!) 149/93     Pulse Rate 03/28/22 0921 (!) 104     Resp 03/28/22 0920 16     Temp 03/28/22 0920 98.3 F (36.8 C)     Temp Source 03/28/22 0920 Oral     SpO2 03/28/22 0921 95 %     Weight 03/28/22 0920 227 lb 15.3 oz (103.4 kg)     Height 03/28/22 0920 5\' 7"  (1.702 m)     Head Circumference --      Peak Flow --      Pain Score 03/28/22 0920 0     Pain Loc --      Pain Edu? --       Excl. in [place]? --     Most recent vital signs: Vitals:   03/28/22 0920 03/28/22 0921  BP:  (!) 149/93  Pulse:  (!) 104  Resp: 16   Temp: 98.3 F (36.8 C)   SpO2:  95%     General: Awake, no distress.  CV:  Good peripheral perfusion.  Normal tones rate approximately 100 Resp:  Normal effort.  Clear bilaterally Abd:  No distention.  Other:    Patient denies pregnancy   ED Results / Procedures / Treatments   Labs (all labs ordered are listed, but only abnormal results are displayed) Labs Reviewed - No data to display   EKG     RADIOLOGY     PROCEDURES:  Critical Care performed: No  Procedures   MEDICATIONS ORDERED IN ED: Medications - No data to display   IMPRESSION / MDM / ASSESSMENT AND PLAN / ED COURSE  I reviewed the triage vital signs and the nursing notes.  Differential diagnosis includes, but is not limited to, probable recent viral illness now improving or nearly Recovered.  No clinical signs or symptoms suggestive of acute bacterial illness.  Patient reports significant symptoms earlier in the week but have now improved and she feels much better.  It seems that in all honesty she reports that she came to the ER to get a work note so she could return to work.  She is awake alert nontoxic well-appearing.  We discussed careful return precautions, offered viral testing such as COVID and influenza but patient reports she does not want to results she simply just wants to go back to work.  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  Return precautions and treatment recommendations and follow-up discussed with the patient who is agreeable with the  plan.   Patient's presentation is most consistent with acute, uncomplicated illness.          FINAL CLINICAL IMPRESSION(S) / ED DIAGNOSES   Final diagnoses:  Viral syndrome     Rx / DC Orders   ED Discharge Orders     None        Note:  This document was prepared using Dragon voice recognition software and may include unintentional dictation errors.   Delman Kitten, MD 03/28/22 [phone]

## 2022-03-28 NOTE — ED Notes (Signed)
Pt in with co n.v.d since Monday, states has had fevers. Has been able to drink fluids for the last 24 hrs.

## 2022-03-28 NOTE — ED Notes (Signed)
Patient discharged to home per MD order. Patient in stable condition, and deemed medically cleared by ED provider for discharge. Discharge instructions reviewed with patient/family using "Teach Back"; verbalized understanding of medication education and administration, and information about follow-up care. Denies further concerns. ° °

## 2022-06-24 ENCOUNTER — Other Ambulatory Visit: Payer: Self-pay

## 2022-06-24 ENCOUNTER — Emergency Department
Admission: EM | Admit: 2022-06-24 | Discharge: 2022-06-24 | Disposition: A | Discharge status: Home / Self Care | Payer: Self-pay | Attending: Emergency Medicine | Admitting: Emergency Medicine

## 2022-06-24 ENCOUNTER — Emergency Department: Payer: Self-pay

## 2022-06-24 DIAGNOSIS — X501XXA Overexertion from prolonged static or awkward postures, initial encounter: Secondary | ICD-10-CM | POA: Insufficient documentation

## 2022-06-24 DIAGNOSIS — S93602A Unspecified sprain of left foot, initial encounter: Secondary | ICD-10-CM | POA: Insufficient documentation

## 2022-06-24 DIAGNOSIS — M79672 Pain in left foot: Secondary | ICD-10-CM

## 2022-06-24 NOTE — ED Notes (Signed)
Pt able to move injured foot but not without inc in discomfort; feet equal in warmth; pt has full sensation to injured foot per pt.

## 2022-06-24 NOTE — ED Provider Notes (Signed)
[facility] Provider Note    Event Date/Time   First MD Initiated Contact with Patient 06/24/22 [phone]     (approximate)   History   Foot Injury   HPI  Cassandra Caldwell is a 37 y.o. female with history of seizures, migraines and asthma presents emergency department with left foot pain.  Patient states she twisted it yesterday.  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  States she does a lot of walking.  Complaining of pain when she puts weight on the left foot.  Swelling.  Denies ankle pain knee pain or any injury      Physical Exam   Triage Vital Signs: ED Triage Vitals  Enc Vitals Group     BP 06/24/22 0844 (!) 142/95     Pulse Rate 06/24/22 0844 (!) 105     Resp 06/24/22 0843 18     Temp 06/24/22 0843 98.1 F (36.7 C)     Temp Source 06/24/22 0843 Oral     SpO2 06/24/22 0844 97 %     Weight 06/24/22 0843 230 lb (104.3 kg)     Height 06/24/22 0843 5\' 7"  (1.702 m)     Head Circumference --      Peak Flow --      Pain Score 06/24/22 0842 6     Pain Loc --      Pain Edu? --      Excl. in [place]? --     Most recent vital signs: Vitals:   06/24/22 0843 06/24/22 0844  BP:  (!) 142/95  Pulse:  (!) 105  Resp: 18   Temp: 98.1 F (36.7 C)   SpO2:  97%     General: Awake, no distress.   CV:  Good peripheral perfusion  regular rate and  rhythm Resp:  Normal effort.  Abd:  No distention.   Other:  Left foot tender along the fourth and fifth metatarsals dorsally, no bruising noted, no swelling noted, neurovascular intact   ED Results / Procedures / Treatments   Labs (all labs ordered are listed, but only abnormal results are displayed) Labs Reviewed - No data to display   EKG     RADIOLOGY X-ray of the left foot    PROCEDURES:   Procedures   MEDICATIONS ORDERED IN ED: Medications - No data to display   IMPRESSION / MDM / [place] / ED COURSE  I reviewed the triage vital signs and the  nursing notes.                              Differential diagnosis includes, but is not limited to, fracture, sprain, contusion  Patient's presentation is most consistent with acute complicated illness / injury requiring diagnostic workup.   X-ray of the left foot was independently reviewed and interpreted by me as being negative for any acute abnormality  Did explain the findings to the patient.  This is more of a sprain.  She was placed in Ace wrap, postop shoe and given crutches to use as needed.  Elevate and ice.  Take ibuprofen.  Follow-up with podiatry if not improving 1 week.  Patient is in agreement treatment plan.  Discharged stable condition.      FINAL CLINICAL IMPRESSION(S) / ED DIAGNOSES   Final diagnoses:  Acute foot pain, left  Sprain of left foot, initial encounter     Rx / DC Orders  ED Discharge Orders     None        Note:  This document was prepared using Dragon voice recognition software and may include unintentional dictation errors.    Versie Starks, PA-C 06/24/22 [id number]    Delman Kitten, MD 06/24/22 [phone]

## 2022-06-24 NOTE — Discharge Instructions (Signed)
Elevate and ice Take ibuprofen for pain Wear the ace wrap and post op shoe to calm down the inflammation in your foot Follow up with podiatry if not improving in 1 week

## 2022-06-24 NOTE — ED Notes (Signed)
See triage note. SF, PA updating pt. Pt's resp reg/unlabored, skin dry and resting calmly on stretcher.

## 2022-06-24 NOTE — ED Triage Notes (Signed)
Pt to ED for left foot injury. States stepped on a rock wrong yesterday and this morning foot is bruised and swollen.

## 2023-07-30 ENCOUNTER — Ambulatory Visit
Admission: RE | Admit: 2023-07-30 | Discharge: 2023-07-30 | Disposition: A | Discharge status: Home / Self Care | Payer: Self-pay | Source: Ambulatory Visit

## 2023-07-30 VITALS — BP 135/90 | HR 100 | Temp 97.7°F | Resp 18

## 2023-07-30 DIAGNOSIS — K529 Noninfective gastroenteritis and colitis, unspecified: Secondary | ICD-10-CM

## 2023-07-30 HISTORY — DX: Malignant (primary) neoplasm, unspecified: C80.1

## 2023-07-30 MED ORDER — AZITHROMYCIN 500 MG PO TABS: 500.0000 mg | ORAL_TABLET | Freq: Every day | ORAL | 0 refills | Status: AC

## 2023-07-30 MED ORDER — DIPHENOXYLATE-ATROPINE 2.5-0.025 MG PO TABS: 1.0000 | ORAL_TABLET | Freq: Four times a day (QID) | ORAL | 0 refills | Status: AC | PRN

## 2023-07-30 MED ORDER — AZITHROMYCIN 500 MG PO TABS: 500.0000 mg | ORAL_TABLET | Freq: Every day | ORAL | 0 refills | Status: DC

## 2023-07-30 NOTE — Discharge Instructions (Addendum)
 Your symptoms are most likely caused by a virus over if no improvement in your symptoms by Saturday pick up azithromycin from pharmacy and take as directed  You can use lomitol twice a day once in the morning once in the evening and food to help with diarrhea, and be mindful over use of this medication may cause opposite effect constipation  You can use over-the-counter ibuprofen  or Tylenol, which ever you have at home, to help manage fevers  Continue to promote hydration throughout the day by using electrolyte replacement solution such as Gatorade, body armor, Pedialyte, which ever you have at home  Try eating bland foods such as bread, rice, toast, fruit which are easier on the stomach to digest, avoid foods that are overly spicy, overly seasoned or greasy

## 2023-07-30 NOTE — ED Provider Notes (Signed)
 [facility]    CSN: [id number] Arrival date & time: 07/30/23  1254      History   Chief Complaint Chief Complaint  Patient presents with   Abdominal Pain    Stomach been cramping bad since Monday and having real bad diarrhea - Entered by patient    HPI Cassandra Caldwell is a 38 y.o. female.   Patient presents for evaluation of intermittent lower abdominal cramping and yellow watery diarrhea beginning 2 days ago.  Associated nausea without vomiting has resolved.  Has attempted use of increase fluid intake and a bland diet with minimal relief, has run out of her omeprazole which she did purchase today and endorses that symptoms have flared her acid reflux and heartburn.  Known sick contact with similar symptoms.  Denies fever or URI symptoms.  Past Medical History:  Diagnosis Date   Asthma    Cancer (HCC)    Large B-cell Lymphoma   GERD (gastroesophageal reflux disease)    Migraines    Pituitary abnormality (HCC)    Seizures (HCC)     There are no active problems to display for this patient.   Past Surgical History:  Procedure Laterality Date   MOUTH SURGERY     TERATOMA EXCISION     TUBAL LIGATION      OB History   No obstetric history on file.      Home Medications    Prior to Admission medications   Medication Sig Start Date End Date Taking? Authorizing Provider  diphenoxylate-atropine (LOMOTIL) 2.5-0.025 MG tablet Take 1 tablet by mouth 4 (four) times daily as needed for diarrhea or loose stools. 07/30/23  Yes Daiya Tamer, Maybelle Spatz, NP  albuterol  (PROVENTIL  HFA;VENTOLIN  HFA) 108 (90 Base) MCG/ACT inhaler Inhale 2 puffs into the lungs every 6 (six) hours as needed for wheezing or shortness of breath. Patient not taking: Reported on 07/30/2023 08/14/15   Stafford Eagles, PA-C  azithromycin (ZITHROMAX) 500 MG tablet Take 1 tablet (500 mg total) by mouth daily for 3 days. 08/02/23 08/05/23  Reena Canning, NP  diphenhydrAMINE  (BENADRYL ) 25 mg capsule  Take 2 capsules (50 mg total) by mouth every 6 (six) hours as needed. Patient not taking: Reported on 07/30/2023 01/15/16   Jacquie Maudlin, MD  esomeprazole (NEXIUM) 20 MG capsule Take 20 mg by mouth.    [provider]  LORazepam (ATIVAN) 1 MG tablet Take by mouth.    [provider]  metoCLOPramide  (REGLAN ) 10 MG tablet Take 1 tablet (10 mg total) by mouth every 6 (six) hours as needed. Patient not taking: Reported on 07/30/2023 01/15/16   Jacquie Maudlin, MD  predniSONE  (DELTASONE ) 10 MG tablet Take 6 tablets  today, on day 2 take 5 tablets, day 3 take 4 tablets, day 4 take 3 tablets, day 5 take  2 tablets and 1 tablet the last day Patient not taking: Reported on 07/30/2023 08/14/15   Stafford Eagles, PA-C    Family History Family History  Problem Relation Age of Onset   Cancer Mother    Diabetes Mother    COPD Mother    Congestive Heart Failure Mother    Cancer Father    Seizures Father     Social History Social History   Tobacco Use   Smoking status: Every Day    Current packs/day: 0.50    Types: Cigarettes   Smokeless tobacco: Never  Vaping Use   Vaping status: Never Used  Substance Use Topics   Alcohol use:  No    Comment: occasionally   Drug use: Yes    Types: Marijuana     Allergies   Latex, Magnesium-containing compounds, Phenergan [promethazine hcl], Sulfa antibiotics, and Penicillins   Review of Systems Review of Systems   Physical Exam Triage Vital Signs ED Triage Vitals  Encounter Vitals Group     BP 07/30/23 1312 (!) 135/90     Systolic BP Percentile --      Diastolic BP Percentile --      Pulse Rate 07/30/23 1312 100     Resp 07/30/23 1312 18     Temp 07/30/23 1312 97.7 F (36.5 C)     Temp src --      SpO2 07/30/23 1312 97 %     Weight --      Height --      Head Circumference --      Peak Flow --      Pain Score 07/30/23 1306 7     Pain Loc --      Pain Education --      Exclude from Growth Chart --    No data  found.  Updated Vital Signs BP (!) 135/90   Pulse 100   Temp 97.7 F (36.5 C)   Resp 18   LMP 07/28/2023   SpO2 97%   Visual Acuity Right Eye Distance:   Left Eye Distance:   Bilateral Distance:    Right Eye Near:   Left Eye Near:    Bilateral Near:     Physical Exam Constitutional:      Appearance: Normal appearance.  Eyes:     Extraocular Movements: Extraocular movements intact.  Pulmonary:     Effort: Pulmonary effort is normal.  Abdominal:     General: Abdomen is flat. Bowel sounds are increased.     Palpations: Abdomen is soft.     Tenderness: There is abdominal tenderness in the right lower quadrant, epigastric area and left lower quadrant.  Neurological:     Mental Status: She is alert and oriented to person, place, and time. Mental status is at baseline.      UC Treatments / Results  Labs (all labs ordered are listed, but only abnormal results are displayed) Labs Reviewed - No data to display  EKG   Radiology No results found.  Procedures Procedures (including critical care time)  Medications Ordered in UC Medications - No data to display  Initial Impression / Assessment and Plan / UC Course  I have reviewed the triage vital signs and the nursing notes.  Pertinent labs & imaging results that were available during my care of the patient were reviewed by me and considered in my medical decision making (see chart for details).  Gastroenteritis  Vital signs are stable, patient no signs of distress nontoxic-appearing, sitting comfortably within exam room, intestinal tenderness and epigastric tenderness noted on abdominal exam with increased bowel sounds, no guarding, low suspicion for an acutely inflamed or infected organ, etiology most likely related to viral illness as she has a known sick contact, discussed, prescribed Lomotil and recommended over-the-counter supportive management, advised increase fluid intake until symptoms have resolved, watch and  wait antibiotic placed at pharmacy if no improvement seen Final Clinical Impressions(s) / UC Diagnoses   Final diagnoses:  Gastroenteritis     Discharge Instructions      Your symptoms are most likely caused by a virus over if no improvement in your symptoms by Saturday pick up azithromycin from pharmacy and  take as directed  You can use lomitol twice a day once in the morning once in the evening and food to help with diarrhea, and be mindful over use of this medication may cause opposite effect constipation  You can use over-the-counter ibuprofen  or Tylenol, which ever you have at home, to help manage fevers  Continue to promote hydration throughout the day by using electrolyte replacement solution such as Gatorade, body armor, Pedialyte, which ever you have at home  Try eating bland foods such as bread, rice, toast, fruit which are easier on the stomach to digest, avoid foods that are overly spicy, overly seasoned or greasy    ED Prescriptions     Medication Sig Dispense Auth. Provider   diphenoxylate-atropine (LOMOTIL) 2.5-0.025 MG tablet Take 1 tablet by mouth 4 (four) times daily as needed for diarrhea or loose stools. 12 tablet Damary Doland R, NP   azithromycin (ZITHROMAX) 500 MG tablet  (Status: Discontinued) Take 1 tablet (500 mg total) by mouth daily for 3 days. 3 tablet Okey Zelek R, NP   azithromycin (ZITHROMAX) 500 MG tablet Take 1 tablet (500 mg total) by mouth daily for 3 days. 3 tablet Delinda Malan R, NP      I have reviewed the PDMP during this encounter.   Reena Canning, NP 07/30/23 [phone]

## 2023-07-30 NOTE — ED Triage Notes (Signed)
 Patient to Urgent Care with complaints of stomach cramps and diarrhea. Unsure of any fevers. No vomiting.   Symptoms started Sunday night. No sick contacts.
